# Patient Record
Sex: Female | Born: 1935 | Race: White | Hispanic: No | Marital: Married | Smoking: Former smoker
Health system: Southern US, Community
[De-identification: ages and names within clinical notes are randomized; demographics above are authoritative.]

## PROBLEM LIST (undated history)

## (undated) DIAGNOSIS — C189 Malignant neoplasm of colon, unspecified: Secondary | ICD-10-CM

## (undated) DIAGNOSIS — M069 Rheumatoid arthritis, unspecified: Secondary | ICD-10-CM

## (undated) DIAGNOSIS — D869 Sarcoidosis, unspecified: Secondary | ICD-10-CM

## (undated) HISTORY — PX: HEMICOLECTOMY: SHX854

## (undated) HISTORY — PX: TONSILLECTOMY: SUR1361

## (undated) HISTORY — PX: COLONOSCOPY: SHX174

## (undated) HISTORY — PX: BREAST EXCISIONAL BIOPSY: SUR124

---

## 2003-07-20 DIAGNOSIS — C189 Malignant neoplasm of colon, unspecified: Secondary | ICD-10-CM

## 2003-07-20 HISTORY — DX: Malignant neoplasm of colon, unspecified: C18.9

## 2003-12-24 ENCOUNTER — Other Ambulatory Visit: Admission: RE | Admit: 2003-12-24 | Discharge: 2003-12-24 | Payer: Self-pay | Admitting: Gynecology

## 2005-01-06 ENCOUNTER — Ambulatory Visit: Payer: Self-pay | Admitting: Gastroenterology

## 2005-05-13 ENCOUNTER — Ambulatory Visit: Payer: Self-pay | Admitting: Internal Medicine

## 2005-11-15 ENCOUNTER — Other Ambulatory Visit: Admission: RE | Admit: 2005-11-15 | Discharge: 2005-11-15 | Payer: Self-pay | Admitting: Gynecology

## 2006-05-16 ENCOUNTER — Ambulatory Visit: Payer: Self-pay | Admitting: Internal Medicine

## 2007-07-04 ENCOUNTER — Ambulatory Visit: Payer: Self-pay | Admitting: Internal Medicine

## 2008-04-02 ENCOUNTER — Ambulatory Visit: Payer: Self-pay | Admitting: Unknown Physician Specialty

## 2008-07-04 ENCOUNTER — Ambulatory Visit: Payer: Self-pay

## 2008-09-11 ENCOUNTER — Ambulatory Visit: Payer: Self-pay | Admitting: Internal Medicine

## 2009-09-12 ENCOUNTER — Ambulatory Visit: Payer: Self-pay | Admitting: Internal Medicine

## 2010-09-14 ENCOUNTER — Ambulatory Visit: Payer: Self-pay | Admitting: Internal Medicine

## 2011-09-17 ENCOUNTER — Ambulatory Visit: Payer: Self-pay | Admitting: Internal Medicine

## 2011-11-30 ENCOUNTER — Ambulatory Visit: Payer: Self-pay | Admitting: Unknown Physician Specialty

## 2012-11-15 ENCOUNTER — Ambulatory Visit: Payer: Self-pay | Admitting: Internal Medicine

## 2014-08-15 ENCOUNTER — Ambulatory Visit: Payer: Self-pay | Admitting: Internal Medicine

## 2015-07-23 DIAGNOSIS — D510 Vitamin B12 deficiency anemia due to intrinsic factor deficiency: Secondary | ICD-10-CM | POA: Diagnosis not present

## 2015-07-23 DIAGNOSIS — M0579 Rheumatoid arthritis with rheumatoid factor of multiple sites without organ or systems involvement: Secondary | ICD-10-CM | POA: Diagnosis not present

## 2015-07-23 DIAGNOSIS — Z79899 Other long term (current) drug therapy: Secondary | ICD-10-CM | POA: Diagnosis not present

## 2015-07-31 DIAGNOSIS — D510 Vitamin B12 deficiency anemia due to intrinsic factor deficiency: Secondary | ICD-10-CM | POA: Diagnosis not present

## 2015-07-31 DIAGNOSIS — Z0000 Encounter for general adult medical examination without abnormal findings: Secondary | ICD-10-CM | POA: Diagnosis not present

## 2015-08-01 ENCOUNTER — Other Ambulatory Visit: Payer: Self-pay | Admitting: Internal Medicine

## 2015-08-01 DIAGNOSIS — Z1231 Encounter for screening mammogram for malignant neoplasm of breast: Secondary | ICD-10-CM

## 2015-08-18 ENCOUNTER — Ambulatory Visit
Admission: RE | Admit: 2015-08-18 | Discharge: 2015-08-18 | Disposition: A | Discharge status: Home / Self Care | Payer: PPO | Source: Ambulatory Visit | Attending: Internal Medicine | Admitting: Internal Medicine

## 2015-08-18 ENCOUNTER — Other Ambulatory Visit: Payer: Self-pay | Admitting: Internal Medicine

## 2015-08-18 DIAGNOSIS — Z1231 Encounter for screening mammogram for malignant neoplasm of breast: Secondary | ICD-10-CM

## 2015-10-24 DIAGNOSIS — Z79899 Other long term (current) drug therapy: Secondary | ICD-10-CM | POA: Diagnosis not present

## 2015-10-24 DIAGNOSIS — M0579 Rheumatoid arthritis with rheumatoid factor of multiple sites without organ or systems involvement: Secondary | ICD-10-CM | POA: Diagnosis not present

## 2015-10-30 DIAGNOSIS — M0579 Rheumatoid arthritis with rheumatoid factor of multiple sites without organ or systems involvement: Secondary | ICD-10-CM | POA: Diagnosis not present

## 2015-10-30 DIAGNOSIS — Z79899 Other long term (current) drug therapy: Secondary | ICD-10-CM | POA: Diagnosis not present

## 2015-10-30 DIAGNOSIS — M150 Primary generalized (osteo)arthritis: Secondary | ICD-10-CM | POA: Diagnosis not present

## 2016-01-28 DIAGNOSIS — Z79899 Other long term (current) drug therapy: Secondary | ICD-10-CM | POA: Diagnosis not present

## 2016-01-28 DIAGNOSIS — M0579 Rheumatoid arthritis with rheumatoid factor of multiple sites without organ or systems involvement: Secondary | ICD-10-CM | POA: Diagnosis not present

## 2016-02-06 DIAGNOSIS — H2512 Age-related nuclear cataract, left eye: Secondary | ICD-10-CM | POA: Diagnosis not present

## 2016-04-29 DIAGNOSIS — M0579 Rheumatoid arthritis with rheumatoid factor of multiple sites without organ or systems involvement: Secondary | ICD-10-CM | POA: Diagnosis not present

## 2016-04-29 DIAGNOSIS — Z79899 Other long term (current) drug therapy: Secondary | ICD-10-CM | POA: Diagnosis not present

## 2016-07-26 DIAGNOSIS — Z79899 Other long term (current) drug therapy: Secondary | ICD-10-CM | POA: Diagnosis not present

## 2016-07-27 DIAGNOSIS — Z0000 Encounter for general adult medical examination without abnormal findings: Secondary | ICD-10-CM | POA: Diagnosis not present

## 2016-07-27 DIAGNOSIS — D510 Vitamin B12 deficiency anemia due to intrinsic factor deficiency: Secondary | ICD-10-CM | POA: Diagnosis not present

## 2016-08-06 DIAGNOSIS — M059 Rheumatoid arthritis with rheumatoid factor, unspecified: Secondary | ICD-10-CM | POA: Diagnosis not present

## 2016-08-06 DIAGNOSIS — Z0000 Encounter for general adult medical examination without abnormal findings: Secondary | ICD-10-CM | POA: Diagnosis not present

## 2016-08-06 DIAGNOSIS — Z85038 Personal history of other malignant neoplasm of large intestine: Secondary | ICD-10-CM | POA: Diagnosis not present

## 2016-08-12 ENCOUNTER — Other Ambulatory Visit: Payer: Self-pay | Admitting: Internal Medicine

## 2016-08-12 DIAGNOSIS — Z1231 Encounter for screening mammogram for malignant neoplasm of breast: Secondary | ICD-10-CM

## 2016-09-09 ENCOUNTER — Ambulatory Visit: Payer: PPO | Attending: Internal Medicine

## 2016-10-08 DIAGNOSIS — Z86010 Personal history of colonic polyps: Secondary | ICD-10-CM | POA: Diagnosis not present

## 2016-10-12 ENCOUNTER — Ambulatory Visit
Admission: RE | Admit: 2016-10-12 | Discharge: 2016-10-12 | Disposition: A | Discharge status: Home / Self Care | Payer: PPO | Source: Ambulatory Visit | Attending: Internal Medicine | Admitting: Internal Medicine

## 2016-10-12 ENCOUNTER — Encounter: Payer: Self-pay | Admitting: Radiology

## 2016-10-12 DIAGNOSIS — Z1231 Encounter for screening mammogram for malignant neoplasm of breast: Secondary | ICD-10-CM | POA: Diagnosis not present

## 2016-10-19 DIAGNOSIS — Z79899 Other long term (current) drug therapy: Secondary | ICD-10-CM | POA: Diagnosis not present

## 2016-10-19 DIAGNOSIS — M0579 Rheumatoid arthritis with rheumatoid factor of multiple sites without organ or systems involvement: Secondary | ICD-10-CM | POA: Diagnosis not present

## 2016-10-19 DIAGNOSIS — M150 Primary generalized (osteo)arthritis: Secondary | ICD-10-CM | POA: Diagnosis not present

## 2016-12-14 ENCOUNTER — Encounter: Payer: Self-pay | Admitting: *Deleted

## 2016-12-15 ENCOUNTER — Ambulatory Visit: Payer: PPO | Admitting: Anesthesiology

## 2016-12-15 ENCOUNTER — Encounter: Payer: Self-pay | Admitting: Anesthesiology

## 2016-12-15 ENCOUNTER — Encounter
Admission: RE | Disposition: A | Discharge status: Home / Self Care | Payer: Self-pay | Source: Ambulatory Visit | Attending: Unknown Physician Specialty

## 2016-12-15 ENCOUNTER — Ambulatory Visit
Admission: RE | Admit: 2016-12-15 | Discharge: 2016-12-15 | Disposition: A | Discharge status: Home / Self Care | Payer: PPO | Source: Ambulatory Visit | Attending: Unknown Physician Specialty | Admitting: Unknown Physician Specialty

## 2016-12-15 DIAGNOSIS — D869 Sarcoidosis, unspecified: Secondary | ICD-10-CM | POA: Insufficient documentation

## 2016-12-15 DIAGNOSIS — K5790 Diverticulosis of intestine, part unspecified, without perforation or abscess without bleeding: Secondary | ICD-10-CM | POA: Diagnosis not present

## 2016-12-15 DIAGNOSIS — Z87891 Personal history of nicotine dependence: Secondary | ICD-10-CM | POA: Insufficient documentation

## 2016-12-15 DIAGNOSIS — K6389 Other specified diseases of intestine: Secondary | ICD-10-CM | POA: Diagnosis not present

## 2016-12-15 DIAGNOSIS — Z9049 Acquired absence of other specified parts of digestive tract: Secondary | ICD-10-CM | POA: Insufficient documentation

## 2016-12-15 DIAGNOSIS — K5730 Diverticulosis of large intestine without perforation or abscess without bleeding: Secondary | ICD-10-CM | POA: Diagnosis not present

## 2016-12-15 DIAGNOSIS — Z85038 Personal history of other malignant neoplasm of large intestine: Secondary | ICD-10-CM | POA: Diagnosis not present

## 2016-12-15 DIAGNOSIS — K640 First degree hemorrhoids: Secondary | ICD-10-CM | POA: Diagnosis not present

## 2016-12-15 DIAGNOSIS — K635 Polyp of colon: Secondary | ICD-10-CM | POA: Diagnosis not present

## 2016-12-15 DIAGNOSIS — Z86010 Personal history of colonic polyps: Secondary | ICD-10-CM | POA: Diagnosis not present

## 2016-12-15 DIAGNOSIS — K648 Other hemorrhoids: Secondary | ICD-10-CM | POA: Diagnosis not present

## 2016-12-15 DIAGNOSIS — M069 Rheumatoid arthritis, unspecified: Secondary | ICD-10-CM | POA: Diagnosis not present

## 2016-12-15 DIAGNOSIS — K621 Rectal polyp: Secondary | ICD-10-CM | POA: Insufficient documentation

## 2016-12-15 HISTORY — DX: Rheumatoid arthritis, unspecified: M06.9

## 2016-12-15 HISTORY — DX: Sarcoidosis, unspecified: D86.9

## 2016-12-15 HISTORY — PX: COLONOSCOPY WITH PROPOFOL: SHX5780

## 2016-12-15 SURGERY — COLONOSCOPY WITH PROPOFOL
Anesthesia: General

## 2016-12-15 MED ORDER — PROPOFOL 500 MG/50ML IV EMUL
INTRAVENOUS | Status: DC | PRN
  Administered 2016-12-15: 120 ug/kg/min via INTRAVENOUS

## 2016-12-15 MED ORDER — FENTANYL CITRATE (PF) 100 MCG/2ML IJ SOLN
INTRAMUSCULAR | Status: DC | PRN
  Administered 2016-12-15: 50 ug via INTRAVENOUS

## 2016-12-15 MED ORDER — SODIUM CHLORIDE 0.9 % IV SOLN
INTRAVENOUS | Status: DC
  Administered 2016-12-15: 1000 mL via INTRAVENOUS

## 2016-12-15 MED ORDER — MIDAZOLAM HCL 2 MG/2ML IJ SOLN
INTRAMUSCULAR | Status: DC | PRN
  Administered 2016-12-15: 1 mg via INTRAVENOUS

## 2016-12-15 MED ORDER — MIDAZOLAM HCL 2 MG/2ML IJ SOLN
INTRAMUSCULAR | Status: AC
  Filled 2016-12-15: qty 2

## 2016-12-15 MED ORDER — PROPOFOL 500 MG/50ML IV EMUL
INTRAVENOUS | Status: AC
  Filled 2016-12-15: qty 50

## 2016-12-15 MED ORDER — SODIUM CHLORIDE 0.9 % IV SOLN: INTRAVENOUS | Status: DC

## 2016-12-15 MED ORDER — FENTANYL CITRATE (PF) 100 MCG/2ML IJ SOLN
INTRAMUSCULAR | Status: AC
  Filled 2016-12-15: qty 2

## 2016-12-15 NOTE — Anesthesia Preprocedure Evaluation (Signed)
Anesthesia Evaluation  Patient identified by MRN, date of birth, ID band Patient awake    Reviewed: Allergy & Precautions, H&P , NPO status , Patient's Chart, lab work & pertinent test results, reviewed documented beta blocker date and time   Airway Mallampati: II   Neck ROM: full    Dental  (+) Poor Dentition   Pulmonary neg pulmonary ROS, former smoker,    Pulmonary exam normal        Cardiovascular negative cardio ROS Normal cardiovascular exam Rhythm:regular Rate:Normal     Neuro/Psych negative neurological ROS  negative psych ROS   GI/Hepatic negative GI ROS, Neg liver ROS,   Endo/Other  negative endocrine ROS  Renal/GU negative Renal ROS  negative genitourinary   Musculoskeletal   Abdominal   Peds  Hematology negative hematology ROS (+)   Anesthesia Other Findings Past Medical History: 2005: Cancer ([place])     Comment: colon ca No date: Rheumatoid arthritis ([place]) No date: Sarcoidosis Past Surgical History: No date: BREAST BIOPSY Right No date: COLONOSCOPY No date: HEMICOLECTOMY No date: TONSILLECTOMY BMI    Body Mass Index:  24.50 kg/m     Reproductive/Obstetrics negative OB ROS                             Anesthesia Physical Anesthesia Plan  ASA: II  Anesthesia Plan: General   Post-op Pain Management:    Induction:   Airway Management Planned:   Additional Equipment:   Intra-op Plan:   Post-operative Plan:   Informed Consent: I have reviewed the patients History and Physical, chart, labs and discussed the procedure including the risks, benefits and alternatives for the proposed anesthesia with the patient or authorized representative who has indicated his/her understanding and acceptance.   Dental Advisory Given  Plan Discussed with: CRNA  Anesthesia Plan Comments:         Anesthesia Quick Evaluation

## 2016-12-15 NOTE — Anesthesia Procedure Notes (Signed)
Performed by: COOK-MARTIN, Keyanna Sandefer Pre-anesthesia Checklist: Patient identified, Emergency Drugs available, Suction available, Patient being monitored and Timeout performed Patient Re-evaluated:Patient Re-evaluated prior to inductionOxygen Delivery Method: Nasal cannula Preoxygenation: Pre-oxygenation with 100% oxygen Intubation Type: IV induction Placement Confirmation: positive ETCO2 and CO2 detector       

## 2016-12-15 NOTE — Op Note (Signed)
[facility] Patient Name: Melody Mendoza Procedure Date: 12/15/2016 10:29 AM MRN: [id number] Account #: [id number] Date of Birth: 01/30/1936 Admit Type: Outpatient Age: 81 Room: [facility] ENDO ROOM 3 Gender: Female Note Status: Finalized Procedure:            Colonoscopy Indications:          High risk colon cancer surveillance: Personal history                        of colon cancer Providers:            Manya Silvas, MD Referring MD:         Rusty Aus, MD (Referring MD) Medicines:            Propofol per Anesthesia Complications:        No immediate complications. Procedure:            Pre-Anesthesia Assessment:                       - After reviewing the risks and benefits, the patient                        was deemed in satisfactory condition to undergo the                        procedure.                       After obtaining informed consent, the colonoscope was                        passed under direct vision. Throughout the procedure,                        the patient's blood pressure, pulse, and oxygen                        saturations were monitored continuously. The                        Colonoscope was introduced through the anus and                        advanced to the the ileocolonic anastomosis. The                        colonoscopy was performed without difficulty. The                        patient tolerated the procedure well. The quality of                        the bowel preparation was excellent. Findings:      A small polyp was found in the distal descending colon. The polyp was       sessile. The polyp was removed with a hot snare. Resection and retrieval       were complete.      A diminutive polyp was found in the recto-sigmoid colon. The polyp was       sessile. The polyp was removed with a jumbo cold forceps. Resection and  retrieval were complete.      Multiple medium-mouthed diverticula were found  in the sigmoid colon.      Internal hemorrhoids were found during endoscopy. The hemorrhoids were       small and Grade I (internal hemorrhoids that do not prolapse).      The exam was otherwise without abnormality. Impression:           - One small polyp in the distal descending colon,                        removed with a hot snare. Resected and retrieved.                       - One diminutive polyp at the recto-sigmoid colon,                        removed with a jumbo cold forceps. Resected and                        retrieved.                       - Diverticulosis in the sigmoid colon.                       - Internal hemorrhoids.                       - The examination was otherwise normal. Recommendation:       - Await pathology results. Manya Silvas, MD 12/15/2016 11:12:57 AM This report has been signed electronically. Number of Addenda: 0 Note Initiated On: 12/15/2016 10:29 AM Scope Withdrawal Time: 0 hours 11 minutes 44 seconds  Total Procedure Duration: 0 hours 20 minutes 22 seconds       [facility]

## 2016-12-15 NOTE — Anesthesia Post-op Follow-up Note (Cosign Needed)
Anesthesia QCDR form completed.        

## 2016-12-15 NOTE — H&P (Signed)
   Primary Care Physician:  Rusty Aus, MD Primary Gastroenterologist:  Dr. Vira Agar  Pre-Procedure History & Physical: HPI:  Melody Mendoza is a 81 y.o. female is here for an colonoscopy.   Past Medical History:  Diagnosis Date  . Cancer [facility]) 2005   colon ca  . Rheumatoid arthritis ([place])   . Sarcoidosis     Past Surgical History:  Procedure Laterality Date  . BREAST BIOPSY Right   . COLONOSCOPY    . HEMICOLECTOMY    . TONSILLECTOMY      Prior to Admission medications   Medication Sig Start Date End Date Taking? Authorizing Provider  Calcium Carb-Cholecalciferol (OS-CAL CALCIUM + D3) 500-200 MG-UNIT TABS Take by mouth.   Yes [provider]  Cholecalciferol 1000 units tablet Take 1,000 Units by mouth daily.   Yes [provider]  folic acid (FOLVITE) 1 MG tablet Take 1 mg by mouth daily.   Yes [provider]  methotrexate (RHEUMATREX) 2.5 MG tablet Take 2.5 mg by mouth once a week. Caution:Chemotherapy. Protect from light.   Yes [provider]  vitamin B-12 (CYANOCOBALAMIN) 1000 MCG tablet Take 1,000 mcg by mouth daily.   Yes [provider]    Allergies as of 10/11/2016  . (Not on File)    History reviewed. No pertinent family history.  Social History   Social History  . Marital status: Married    Spouse name: N/A  . Number of children: N/A  . Years of education: N/A   Occupational History  . Not on file.   Social History Main Topics  . Smoking status: Former Research scientist (life sciences)  . Smokeless tobacco: Never Used  . Alcohol use Not on file  . Drug use: Unknown  . Sexual activity: Not on file   Other Topics Concern  . Not on file   Social History Narrative  . No narrative on file    Review of Systems: See HPI, otherwise negative ROS  Physical Exam: BP (!) 122/59   Pulse (!) 57   Temp (!) 96.1 F (35.6 C) (Tympanic)   Resp 18   Ht 5' 4.5" (1.638 m)   Wt 65.8 kg (145 lb)   SpO2 100%   BMI 24.50 kg/m   General:   Alert,  pleasant and cooperative in NAD Head:  Normocephalic and atraumatic. Neck:  Supple; no masses or thyromegaly. Lungs:  Clear throughout to auscultation.    Heart:  Regular rate and rhythm. Abdomen:  Soft, nontender and nondistended. Normal bowel sounds, without guarding, and without rebound.   Neurologic:  Alert and  oriented x4;  grossly normal neurologically.  Impression/Plan: Melody Mendoza is here for an colonoscopy to be performed for [facility] colon cancer  Risks, benefits, limitations, and alternatives regarding  colonoscopy have been reviewed with the patient.  Questions have been answered.  All parties agreeable.   Gaylyn Cheers, MD  12/15/2016, 10:38 AM

## 2016-12-15 NOTE — Transfer of Care (Signed)
Immediate Anesthesia Transfer of Care Note  Patient: Melody Mendoza  Procedure(s) Performed: Procedure(s): COLONOSCOPY WITH PROPOFOL (N/A)  Patient Location: PACU  Anesthesia Type:General  Level of Consciousness: awake and oriented  Airway & Oxygen Therapy: Patient Spontanous Breathing and Patient connected to nasal cannula oxygen  Post-op Assessment: Report given to RN and Post -op Vital signs reviewed and stable  Post vital signs: Reviewed and stable  Last Vitals:  Vitals:   12/15/16 1001  BP: (!) 122/59  Pulse: (!) 57  Resp: 18  Temp: (!) 35.6 C    Last Pain:  Vitals:   12/15/16 1001  TempSrc: Tympanic         Complications: No apparent anesthesia complications

## 2016-12-16 ENCOUNTER — Encounter: Payer: Self-pay | Admitting: Unknown Physician Specialty

## 2016-12-16 LAB — SURGICAL PATHOLOGY

## 2016-12-20 NOTE — Anesthesia Postprocedure Evaluation (Signed)
Anesthesia Post Note  Patient: Melody Mendoza  Procedure(s) Performed: Procedure(s) (LRB): COLONOSCOPY WITH PROPOFOL (N/A)  Patient location during evaluation: PACU Anesthesia Type: General Level of consciousness: awake and alert Pain management: pain level controlled Vital Signs Assessment: post-procedure vital signs reviewed and stable Respiratory status: spontaneous breathing, nonlabored ventilation, respiratory function stable and patient connected to nasal cannula oxygen Cardiovascular status: blood pressure returned to baseline and stable Postop Assessment: no signs of nausea or vomiting Anesthetic complications: no     Last Vitals:  Vitals:   12/15/16 1130 12/15/16 1140  BP: 106/62 117/69  Pulse: 60   Resp: 13   Temp:      Last Pain:  Vitals:   12/16/16 0752  TempSrc:   PainSc: 0-No pain                 Molli Barrows

## 2016-12-21 DIAGNOSIS — K5792 Diverticulitis of intestine, part unspecified, without perforation or abscess without bleeding: Secondary | ICD-10-CM | POA: Diagnosis not present

## 2017-03-23 DIAGNOSIS — H353131 Nonexudative age-related macular degeneration, bilateral, early dry stage: Secondary | ICD-10-CM | POA: Diagnosis not present

## 2017-04-14 DIAGNOSIS — Z0000 Encounter for general adult medical examination without abnormal findings: Secondary | ICD-10-CM | POA: Diagnosis not present

## 2017-04-14 DIAGNOSIS — Z23 Encounter for immunization: Secondary | ICD-10-CM | POA: Diagnosis not present

## 2017-07-27 DIAGNOSIS — H353131 Nonexudative age-related macular degeneration, bilateral, early dry stage: Secondary | ICD-10-CM | POA: Diagnosis not present

## 2017-07-27 DIAGNOSIS — H2512 Age-related nuclear cataract, left eye: Secondary | ICD-10-CM | POA: Diagnosis not present

## 2017-08-02 DIAGNOSIS — E538 Deficiency of other specified B group vitamins: Secondary | ICD-10-CM | POA: Diagnosis not present

## 2017-08-02 DIAGNOSIS — Z0000 Encounter for general adult medical examination without abnormal findings: Secondary | ICD-10-CM | POA: Diagnosis not present

## 2017-08-02 DIAGNOSIS — I10 Essential (primary) hypertension: Secondary | ICD-10-CM | POA: Diagnosis not present

## 2017-08-09 DIAGNOSIS — Z0000 Encounter for general adult medical examination without abnormal findings: Secondary | ICD-10-CM | POA: Diagnosis not present

## 2017-08-09 DIAGNOSIS — M059 Rheumatoid arthritis with rheumatoid factor, unspecified: Secondary | ICD-10-CM | POA: Diagnosis not present

## 2017-08-09 DIAGNOSIS — Z79899 Other long term (current) drug therapy: Secondary | ICD-10-CM | POA: Diagnosis not present

## 2017-09-02 ENCOUNTER — Other Ambulatory Visit: Payer: Self-pay | Admitting: Internal Medicine

## 2017-09-02 DIAGNOSIS — Z1231 Encounter for screening mammogram for malignant neoplasm of breast: Secondary | ICD-10-CM

## 2017-09-16 DIAGNOSIS — H2512 Age-related nuclear cataract, left eye: Secondary | ICD-10-CM | POA: Diagnosis not present

## 2017-09-19 ENCOUNTER — Encounter: Payer: Self-pay | Admitting: *Deleted

## 2017-09-28 ENCOUNTER — Encounter
Admission: RE | Disposition: A | Discharge status: Home / Self Care | Payer: Self-pay | Source: Ambulatory Visit | Attending: Ophthalmology

## 2017-09-28 ENCOUNTER — Encounter: Payer: Self-pay | Admitting: Emergency Medicine

## 2017-09-28 ENCOUNTER — Ambulatory Visit
Admission: RE | Admit: 2017-09-28 | Discharge: 2017-09-28 | Disposition: A | Discharge status: Home / Self Care | Payer: PPO | Source: Ambulatory Visit | Attending: Ophthalmology | Admitting: Ophthalmology

## 2017-09-28 ENCOUNTER — Ambulatory Visit: Payer: PPO | Admitting: Anesthesiology

## 2017-09-28 DIAGNOSIS — Z87891 Personal history of nicotine dependence: Secondary | ICD-10-CM | POA: Insufficient documentation

## 2017-09-28 DIAGNOSIS — H2512 Age-related nuclear cataract, left eye: Secondary | ICD-10-CM | POA: Diagnosis not present

## 2017-09-28 HISTORY — PX: CATARACT EXTRACTION W/PHACO: SHX586

## 2017-09-28 SURGERY — PHACOEMULSIFICATION, CATARACT, WITH IOL INSERTION
Anesthesia: Monitor Anesthesia Care | Site: Eye | Laterality: Left | Wound class: Clean

## 2017-09-28 MED ORDER — BSS IO SOLN
INTRAOCULAR | Status: DC | PRN
  Administered 2017-09-28: 09:00:00 via OPHTHALMIC

## 2017-09-28 MED ORDER — FENTANYL CITRATE (PF) 100 MCG/2ML IJ SOLN
INTRAMUSCULAR | Status: DC | PRN
  Administered 2017-09-28: 50 ug via INTRAVENOUS

## 2017-09-28 MED ORDER — MIDAZOLAM HCL 2 MG/2ML IJ SOLN
INTRAMUSCULAR | Status: AC
  Filled 2017-09-28: qty 2

## 2017-09-28 MED ORDER — MOXIFLOXACIN HCL 0.5 % OP SOLN: 1.0000 [drp] | OPHTHALMIC | Status: DC | PRN

## 2017-09-28 MED ORDER — CARBACHOL 0.01 % IO SOLN
INTRAOCULAR | Status: DC | PRN
  Administered 2017-09-28: 0.5 mL via INTRAOCULAR

## 2017-09-28 MED ORDER — MOXIFLOXACIN HCL 0.5 % OP SOLN
OPHTHALMIC | Status: AC
  Filled 2017-09-28: qty 3

## 2017-09-28 MED ORDER — ONDANSETRON HCL 4 MG/2ML IJ SOLN
INTRAMUSCULAR | Status: DC | PRN
  Administered 2017-09-28: 4 mg via INTRAVENOUS

## 2017-09-28 MED ORDER — ARMC OPHTHALMIC DILATING DROPS
OPHTHALMIC | Status: AC
  Administered 2017-09-28: 1 via OPHTHALMIC
  Filled 2017-09-28: qty 0.4

## 2017-09-28 MED ORDER — NA CHONDROIT SULF-NA HYALURON 40-17 MG/ML IO SOLN
INTRAOCULAR | Status: DC | PRN
  Administered 2017-09-28: 1 mL via INTRAOCULAR

## 2017-09-28 MED ORDER — EPINEPHRINE PF 1 MG/ML IJ SOLN
INTRAMUSCULAR | Status: AC
  Filled 2017-09-28: qty 2

## 2017-09-28 MED ORDER — MIDAZOLAM HCL 2 MG/2ML IJ SOLN
INTRAMUSCULAR | Status: DC | PRN
  Administered 2017-09-28: 1 mg via INTRAVENOUS

## 2017-09-28 MED ORDER — NA CHONDROIT SULF-NA HYALURON 40-17 MG/ML IO SOLN
INTRAOCULAR | Status: AC
  Filled 2017-09-28: qty 1

## 2017-09-28 MED ORDER — SODIUM CHLORIDE 0.9 % IV SOLN
INTRAVENOUS | Status: DC
  Administered 2017-09-28 (×2): via INTRAVENOUS

## 2017-09-28 MED ORDER — POVIDONE-IODINE 5 % OP SOLN
OPHTHALMIC | Status: AC
  Filled 2017-09-28: qty 30

## 2017-09-28 MED ORDER — LIDOCAINE HCL (PF) 4 % IJ SOLN
INTRAMUSCULAR | Status: DC | PRN
  Administered 2017-09-28: 4 mL via OPHTHALMIC

## 2017-09-28 MED ORDER — POVIDONE-IODINE 5 % OP SOLN
OPHTHALMIC | Status: DC | PRN
  Administered 2017-09-28: 1 via OPHTHALMIC

## 2017-09-28 MED ORDER — FENTANYL CITRATE (PF) 100 MCG/2ML IJ SOLN
INTRAMUSCULAR | Status: AC
  Filled 2017-09-28: qty 2

## 2017-09-28 MED ORDER — ARMC OPHTHALMIC DILATING DROPS
1.0000 "application " | OPHTHALMIC | Status: AC
  Administered 2017-09-28 (×3): 1 via OPHTHALMIC

## 2017-09-28 MED ORDER — LIDOCAINE HCL (PF) 4 % IJ SOLN
INTRAMUSCULAR | Status: AC
  Filled 2017-09-28: qty 5

## 2017-09-28 MED ORDER — MOXIFLOXACIN HCL 0.5 % OP SOLN
OPHTHALMIC | Status: DC | PRN
  Administered 2017-09-28: 0.2 mL via OPHTHALMIC

## 2017-09-28 MED ORDER — ONDANSETRON HCL 4 MG/2ML IJ SOLN
INTRAMUSCULAR | Status: AC
  Filled 2017-09-28: qty 2

## 2017-09-28 SURGICAL SUPPLY — 16 items
GLOVE BIO SURGEON STRL SZ8 (GLOVE) ×3 IMPLANT
GLOVE BIOGEL M 6.5 STRL (GLOVE) ×3 IMPLANT
GLOVE SURG LX 8.0 MICRO (GLOVE) ×2
GLOVE SURG LX STRL 8.0 MICRO (GLOVE) ×1 IMPLANT
GOWN STRL REUS W/ TWL LRG LVL3 (GOWN DISPOSABLE) ×2 IMPLANT
GOWN STRL REUS W/TWL LRG LVL3 (GOWN DISPOSABLE) ×4
LABEL CATARACT MEDS ST (LABEL) ×3 IMPLANT
LENS IOL TECNIS ITEC 22.5 (Intraocular Lens) ×3 IMPLANT
PACK CATARACT (MISCELLANEOUS) ×3 IMPLANT
PACK CATARACT BRASINGTON LX (MISCELLANEOUS) ×3 IMPLANT
PACK EYE AFTER SURG (MISCELLANEOUS) ×3 IMPLANT
SOL BSS BAG (MISCELLANEOUS) ×3
SOLUTION BSS BAG (MISCELLANEOUS) ×1 IMPLANT
SYR 5ML LL (SYRINGE) ×3 IMPLANT
WATER STERILE IRR 250ML POUR (IV SOLUTION) ×3 IMPLANT
WIPE NON LINTING 3.25X3.25 (MISCELLANEOUS) ×3 IMPLANT

## 2017-09-28 NOTE — Discharge Instructions (Signed)
Eye Surgery Discharge Instructions  Expect mild scratchy sensation or mild soreness. DO NOT RUB YOUR EYE!  The day of surgery:  Minimal physical activity, but bed rest is not required  No reading, computer work, or close hand work  No bending, lifting, or straining.  May watch TV  For 24 hours:  No driving, legal decisions, or alcoholic beverages  Safety precautions  Eat anything you prefer: It is better to start with liquids, then soup then solid foods.  _____ Eye patch should be worn until postoperative exam tomorrow.  ____ Solar shield eyeglasses should be worn for comfort in the sunlight/patch while sleeping  Resume all regular medications including aspirin or Coumadin if these were discontinued prior to surgery. You may shower, bathe, shave, or wash your hair. Tylenol may be taken for mild discomfort.  Call your doctor if you experience significant pain, nausea, or vomiting, fever > 101 or other signs of infection. [phone] or [phone] Specific instructions:  Follow-up Information    Birder Robson, MD Follow up.   Specialty:  Ophthalmology Why:  March 14 at 10:35am Contact information: [address] [place] [address] [phone]

## 2017-09-28 NOTE — Anesthesia Postprocedure Evaluation (Signed)
Anesthesia Post Note  Patient: Melody Mendoza  Procedure(s) Performed: CATARACT EXTRACTION PHACO AND INTRAOCULAR LENS PLACEMENT ([place]) (Left Eye)  Patient location during evaluation: Short Stay Anesthesia Type: MAC Level of consciousness: awake Pain management: pain level controlled Vital Signs Assessment: post-procedure vital signs reviewed and stable Respiratory status: spontaneous breathing Cardiovascular status: blood pressure returned to baseline Postop Assessment: no headache Anesthetic complications: no     Last Vitals:  Vitals:   09/28/17 0752  BP: 133/69  Pulse: 67  Resp: 17  Temp: (!) 35.8 C  SpO2: 100%    Last Pain:  Vitals:   09/28/17 0752  TempSrc: Tympanic                 Buckner Malta

## 2017-09-28 NOTE — Op Note (Signed)
PREOPERATIVE DIAGNOSIS:  Nuclear sclerotic cataract of the left eye.   POSTOPERATIVE DIAGNOSIS:  Nuclear sclerotic cataract of the left eye.   OPERATIVE PROCEDURE: Procedure(s): CATARACT EXTRACTION PHACO AND INTRAOCULAR LENS PLACEMENT ([facility])   SURGEON:  Birder Robson, MD.   ANESTHESIA:  Anesthesiologist: Alphonsus Sias, MD CRNA: Allean Found, CRNA  1.      Managed anesthesia care. 2.     0.51ml of Shugarcaine was instilled following the paracentesis   COMPLICATIONS:  None.   TECHNIQUE:   Stop and chop   DESCRIPTION OF PROCEDURE:  The patient was examined and consented in the preoperative holding area where the aforementioned topical anesthesia was applied to the left eye and then brought back to the Operating Room where the left eye was prepped and draped in the usual sterile ophthalmic fashion and a lid speculum was placed. A paracentesis was created with the side port blade and the anterior chamber was filled with viscoelastic. A near clear corneal incision was performed with the steel keratome. A continuous curvilinear capsulorrhexis was performed with a cystotome followed by the capsulorrhexis forceps. Hydrodissection and hydrodelineation were carried out with BSS on a blunt cannula. The lens was removed in a stop and chop  technique and the remaining cortical material was removed with the irrigation-aspiration handpiece. The capsular bag was inflated with viscoelastic and the Technis [id number] lens was placed in the capsular bag without complication. The remaining viscoelastic was removed from the eye with the irrigation-aspiration handpiece. The wounds were hydrated. The anterior chamber was flushed with Miostat and the eye was inflated to physiologic pressure. 0.24ml Vigamox was placed in the anterior chamber. The wounds were found to be water tight. The eye was dressed with Vigamox. The patient was given protective glasses to wear throughout the day and a shield with which to sleep  tonight. The patient was also given drops with which to begin a drop regimen today and will follow-up with me in one day. Implant Name Type Inv. Item Serial No. Manufacturer Lot No. LRB No. Used  LENS IOL DIOP 22.5 - [id number] Intraocular Lens LENS IOL DIOP 22.5 [id number] AMO  Left 1    Procedure(s) with comments: CATARACT EXTRACTION PHACO AND INTRAOCULAR LENS PLACEMENT ([facility]) (Left) - [place] 00:28.3 AP% 12.3 CDE 3.39  Fluid Pack Lot # [id number]  Electronically signed: Birder Robson 09/28/2017 8:43 AM

## 2017-09-28 NOTE — Anesthesia Preprocedure Evaluation (Signed)
Anesthesia Evaluation  Patient identified by MRN, date of birth, ID band Patient awake    Reviewed: Allergy & Precautions, H&P , NPO status , reviewed documented beta blocker date and time   Airway Mallampati: II  TM Distance: >3 FB     Dental  (+) Caps   Pulmonary former smoker,    Pulmonary exam normal        Cardiovascular negative cardio ROS Normal cardiovascular exam     Neuro/Psych negative neurological ROS     GI/Hepatic negative GI ROS, Neg liver ROS, neg GERD  ,  Endo/Other  negative endocrine ROS  Renal/GU negative Renal ROS  negative genitourinary   Musculoskeletal   Abdominal   Peds  Hematology negative hematology ROS (+)   Anesthesia Other Findings   Reproductive/Obstetrics negative OB ROS                             Anesthesia Physical Anesthesia Plan  ASA: II  Anesthesia Plan: MAC   Post-op Pain Management:    Induction:   PONV Risk Score and Plan: 2 and Propofol infusion, Ondansetron and Midazolam  Airway Management Planned:   Additional Equipment:   Intra-op Plan:   Post-operative Plan:   Informed Consent: I have reviewed the patients History and Physical, chart, labs and discussed the procedure including the risks, benefits and alternatives for the proposed anesthesia with the patient or authorized representative who has indicated his/her understanding and acceptance.   Dental Advisory Given  Plan Discussed with: CRNA  Anesthesia Plan Comments:         Anesthesia Quick Evaluation

## 2017-09-28 NOTE — Anesthesia Post-op Follow-up Note (Signed)
Anesthesia QCDR form completed.        

## 2017-09-28 NOTE — Anesthesia Procedure Notes (Signed)
Procedure Name: MAC Date/Time: 09/28/2017 8:25 AM Performed by: Allean Found, CRNA Pre-anesthesia Checklist: Patient identified, Emergency Drugs available, Suction available, Patient being monitored and Timeout performed Oxygen Delivery Method: Nasal cannula Placement Confirmation: positive ETCO2

## 2017-09-28 NOTE — H&P (Signed)
All labs reviewed. Abnormal studies sent to patients PCP when indicated.  Previous H&P reviewed, patient examined, there are NO CHANGES.  Melody Swiderski Porfilio3/13/20198:19 AM

## 2017-09-28 NOTE — Transfer of Care (Signed)
Immediate Anesthesia Transfer of Care Note  Patient: Melody Mendoza  Procedure(s) Performed: CATARACT EXTRACTION PHACO AND INTRAOCULAR LENS PLACEMENT ([facility]) (Left Eye)  Patient Location: PACU  Anesthesia Type:MAC  Level of Consciousness: awake  Airway & Oxygen Therapy: Patient Spontanous Breathing  Post-op Assessment: Report given to RN and Post -op Vital signs reviewed and stable  Post vital signs: Reviewed and stable  Last Vitals:  Vitals:   09/28/17 0752  BP: 133/69  Pulse: 67  Resp: 17  Temp: (!) 35.8 C  SpO2: 100%    Last Pain:  Vitals:   09/28/17 0752  TempSrc: Tympanic         Complications: No apparent anesthesia complications

## 2017-09-28 NOTE — OR Nursing (Signed)
dischargre instructions discussed with pt and family. Both voice understanding.

## 2017-10-13 ENCOUNTER — Ambulatory Visit
Admission: RE | Admit: 2017-10-13 | Discharge: 2017-10-13 | Disposition: A | Discharge status: Home / Self Care | Payer: PPO | Source: Ambulatory Visit | Attending: Internal Medicine | Admitting: Internal Medicine

## 2017-10-13 DIAGNOSIS — Z1231 Encounter for screening mammogram for malignant neoplasm of breast: Secondary | ICD-10-CM | POA: Diagnosis not present

## 2017-10-13 HISTORY — DX: Malignant neoplasm of colon, unspecified: C18.9

## 2017-10-19 DIAGNOSIS — Z79899 Other long term (current) drug therapy: Secondary | ICD-10-CM | POA: Diagnosis not present

## 2017-10-19 DIAGNOSIS — M150 Primary generalized (osteo)arthritis: Secondary | ICD-10-CM | POA: Diagnosis not present

## 2017-10-19 DIAGNOSIS — M0579 Rheumatoid arthritis with rheumatoid factor of multiple sites without organ or systems involvement: Secondary | ICD-10-CM | POA: Diagnosis not present

## 2017-10-25 DIAGNOSIS — M150 Primary generalized (osteo)arthritis: Secondary | ICD-10-CM | POA: Diagnosis not present

## 2017-10-25 DIAGNOSIS — M0579 Rheumatoid arthritis with rheumatoid factor of multiple sites without organ or systems involvement: Secondary | ICD-10-CM | POA: Diagnosis not present

## 2018-04-20 DIAGNOSIS — S90129A Contusion of unspecified lesser toe(s) without damage to nail, initial encounter: Secondary | ICD-10-CM | POA: Diagnosis not present

## 2018-04-20 DIAGNOSIS — L821 Other seborrheic keratosis: Secondary | ICD-10-CM | POA: Diagnosis not present

## 2018-04-20 DIAGNOSIS — L84 Corns and callosities: Secondary | ICD-10-CM | POA: Diagnosis not present

## 2018-04-20 DIAGNOSIS — L820 Inflamed seborrheic keratosis: Secondary | ICD-10-CM | POA: Diagnosis not present

## 2018-04-20 DIAGNOSIS — D1801 Hemangioma of skin and subcutaneous tissue: Secondary | ICD-10-CM | POA: Diagnosis not present

## 2018-04-20 DIAGNOSIS — L538 Other specified erythematous conditions: Secondary | ICD-10-CM | POA: Diagnosis not present

## 2018-05-01 DIAGNOSIS — H43813 Vitreous degeneration, bilateral: Secondary | ICD-10-CM | POA: Diagnosis not present

## 2018-06-29 DIAGNOSIS — L309 Dermatitis, unspecified: Secondary | ICD-10-CM | POA: Diagnosis not present

## 2018-06-29 DIAGNOSIS — R58 Hemorrhage, not elsewhere classified: Secondary | ICD-10-CM | POA: Diagnosis not present

## 2018-06-29 DIAGNOSIS — L538 Other specified erythematous conditions: Secondary | ICD-10-CM | POA: Diagnosis not present

## 2018-06-29 DIAGNOSIS — L820 Inflamed seborrheic keratosis: Secondary | ICD-10-CM | POA: Diagnosis not present

## 2018-06-29 DIAGNOSIS — L298 Other pruritus: Secondary | ICD-10-CM | POA: Diagnosis not present

## 2018-08-03 DIAGNOSIS — M059 Rheumatoid arthritis with rheumatoid factor, unspecified: Secondary | ICD-10-CM | POA: Diagnosis not present

## 2018-08-03 DIAGNOSIS — E538 Deficiency of other specified B group vitamins: Secondary | ICD-10-CM | POA: Diagnosis not present

## 2018-08-03 DIAGNOSIS — I10 Essential (primary) hypertension: Secondary | ICD-10-CM | POA: Diagnosis not present

## 2018-08-03 DIAGNOSIS — Z79899 Other long term (current) drug therapy: Secondary | ICD-10-CM | POA: Diagnosis not present

## 2018-08-10 DIAGNOSIS — Z79899 Other long term (current) drug therapy: Secondary | ICD-10-CM | POA: Diagnosis not present

## 2018-08-10 DIAGNOSIS — Z0000 Encounter for general adult medical examination without abnormal findings: Secondary | ICD-10-CM | POA: Diagnosis not present

## 2018-08-14 ENCOUNTER — Other Ambulatory Visit: Payer: Self-pay | Admitting: Internal Medicine

## 2018-08-14 DIAGNOSIS — Z1231 Encounter for screening mammogram for malignant neoplasm of breast: Secondary | ICD-10-CM

## 2018-09-19 DIAGNOSIS — L309 Dermatitis, unspecified: Secondary | ICD-10-CM | POA: Diagnosis not present

## 2019-02-13 ENCOUNTER — Ambulatory Visit
Admission: RE | Admit: 2019-02-13 | Discharge: 2019-02-13 | Disposition: A | Discharge status: Home / Self Care | Payer: PPO | Source: Ambulatory Visit | Attending: Internal Medicine | Admitting: Internal Medicine

## 2019-02-13 ENCOUNTER — Other Ambulatory Visit: Payer: Self-pay

## 2019-02-13 DIAGNOSIS — Z1231 Encounter for screening mammogram for malignant neoplasm of breast: Secondary | ICD-10-CM | POA: Insufficient documentation

## 2019-05-07 DIAGNOSIS — H353131 Nonexudative age-related macular degeneration, bilateral, early dry stage: Secondary | ICD-10-CM | POA: Diagnosis not present

## 2019-08-06 DIAGNOSIS — Z79899 Other long term (current) drug therapy: Secondary | ICD-10-CM | POA: Diagnosis not present

## 2019-08-13 DIAGNOSIS — Z0000 Encounter for general adult medical examination without abnormal findings: Secondary | ICD-10-CM | POA: Diagnosis not present

## 2019-08-13 DIAGNOSIS — Z780 Asymptomatic menopausal state: Secondary | ICD-10-CM | POA: Diagnosis not present

## 2019-08-13 DIAGNOSIS — Z79899 Other long term (current) drug therapy: Secondary | ICD-10-CM | POA: Diagnosis not present

## 2019-08-16 DIAGNOSIS — M8588 Other specified disorders of bone density and structure, other site: Secondary | ICD-10-CM | POA: Diagnosis not present

## 2019-12-16 IMAGING — MG MM DIGITAL SCREENING BILAT W/ TOMO W/ CAD
9 of 12 series · 9 of 28 positions shown · non-contrast
Comparison: Previous exam(s).

CLINICAL DATA: Screening.

EXAM:
DIGITAL SCREENING BILATERAL MAMMOGRAM WITH TOMO AND CAD

[R MLO]
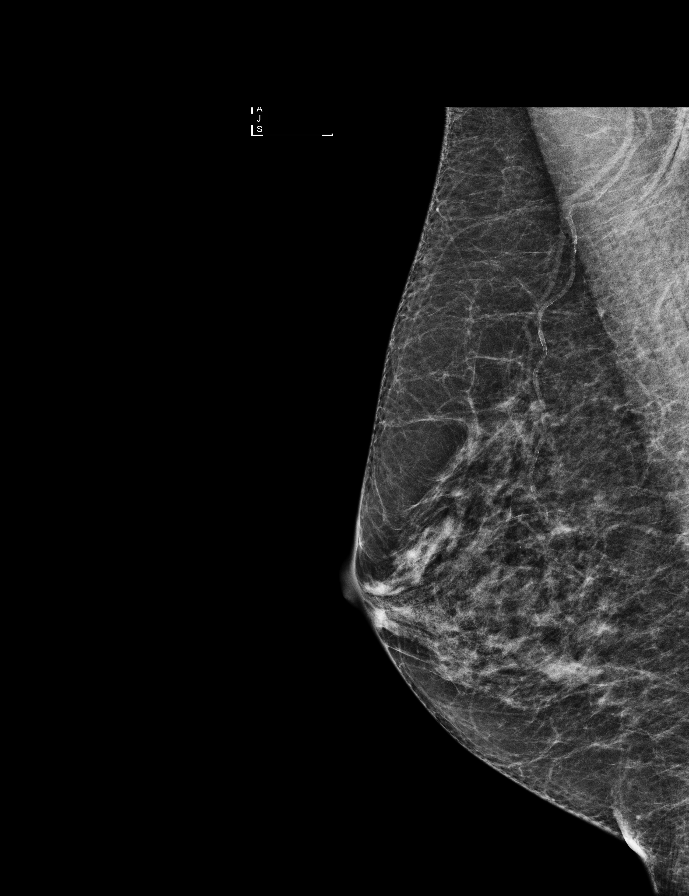

[L CC synth-2D]
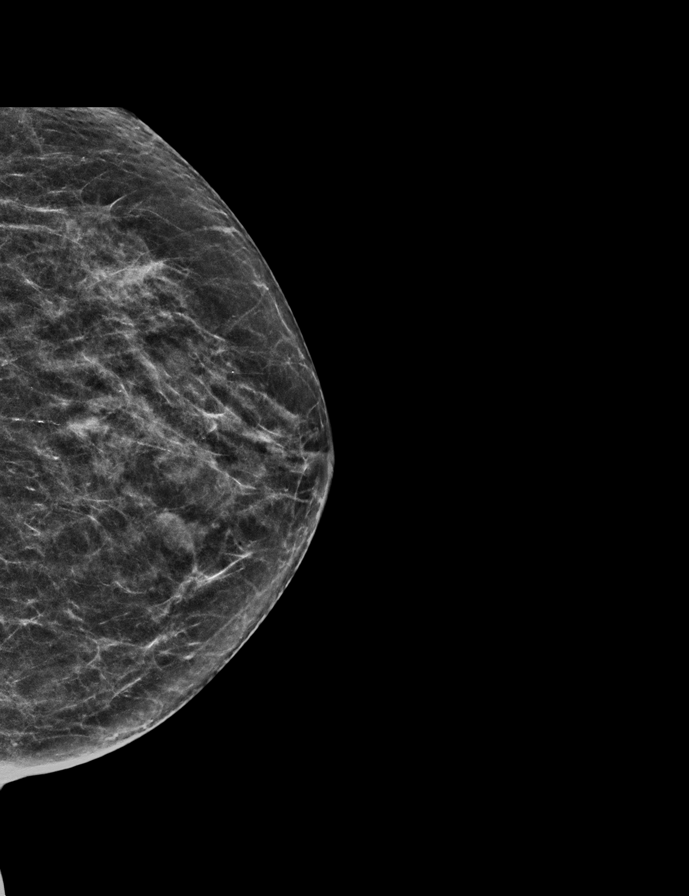

[R CC synth-2D]
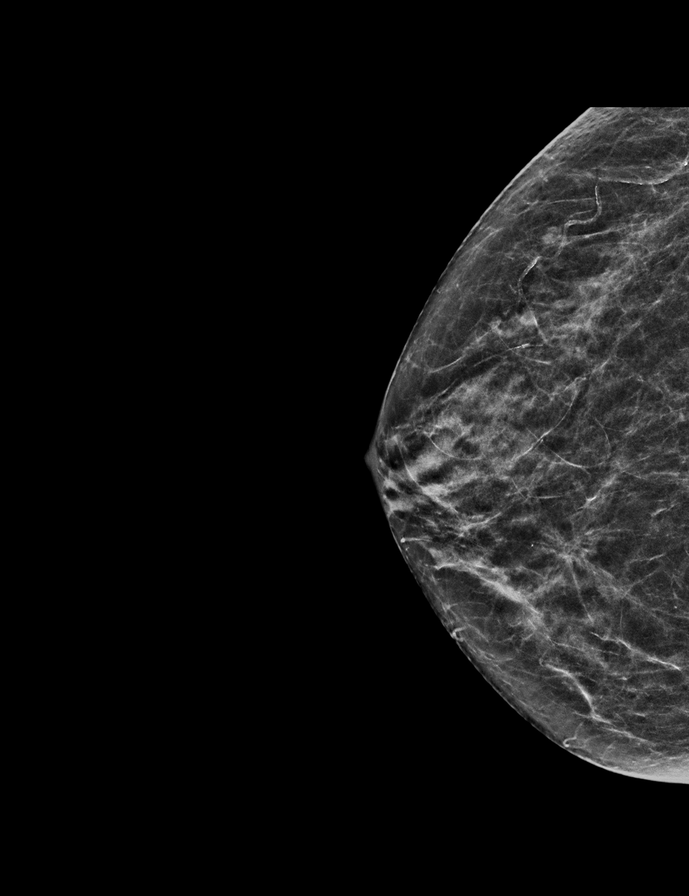

[R CC]
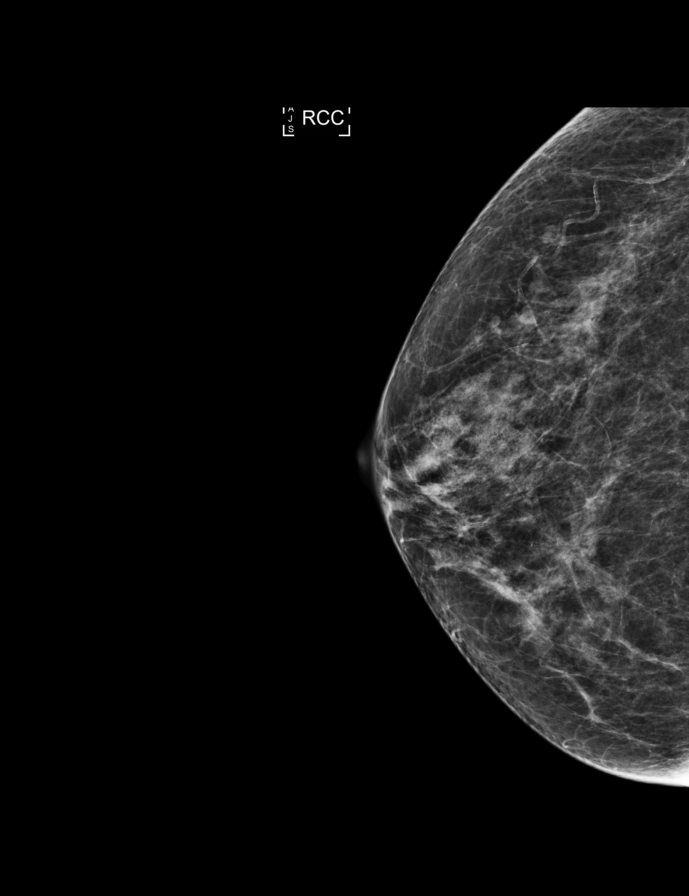

[L CC]
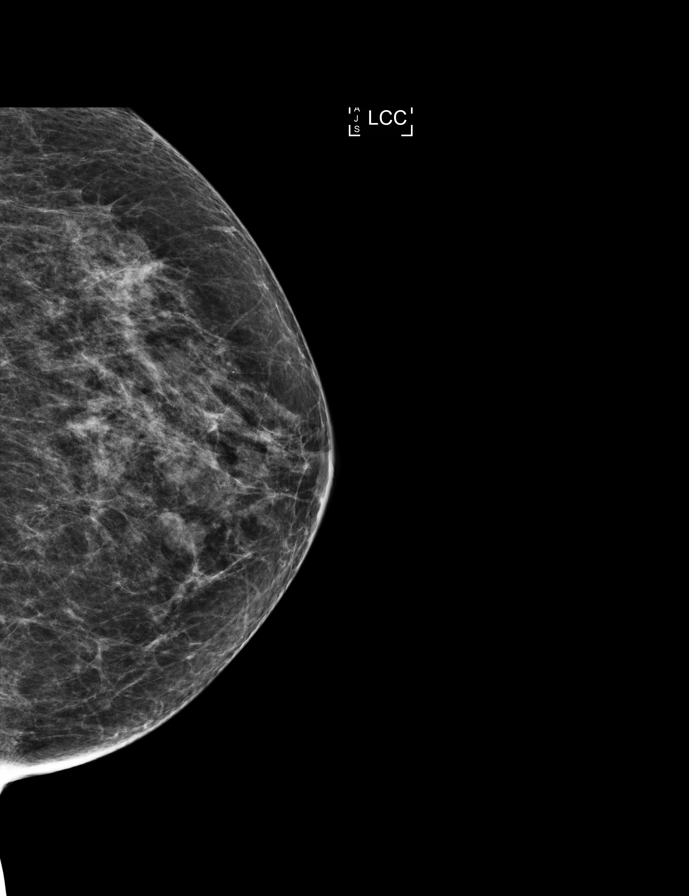

[R MLO synth-2D]
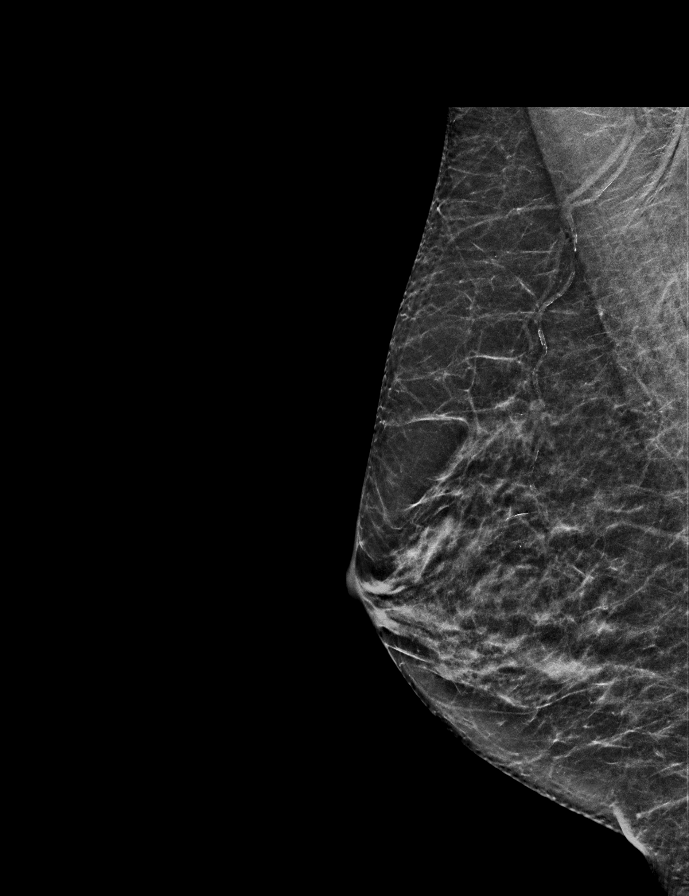

[L MLO synth-2D]
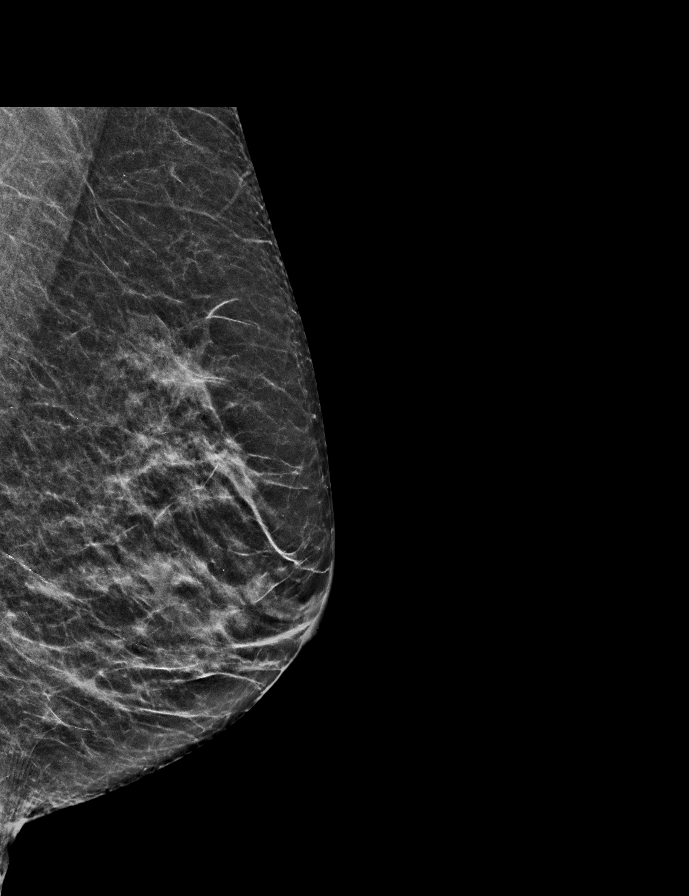

[L MLO]
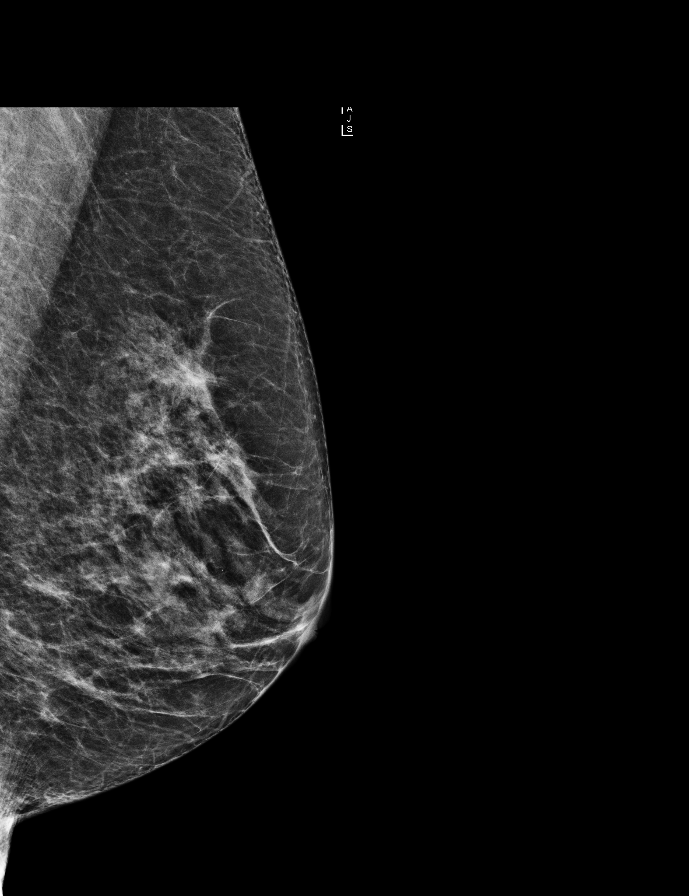

[L MLO tomo · tomo slice 25/48.0]
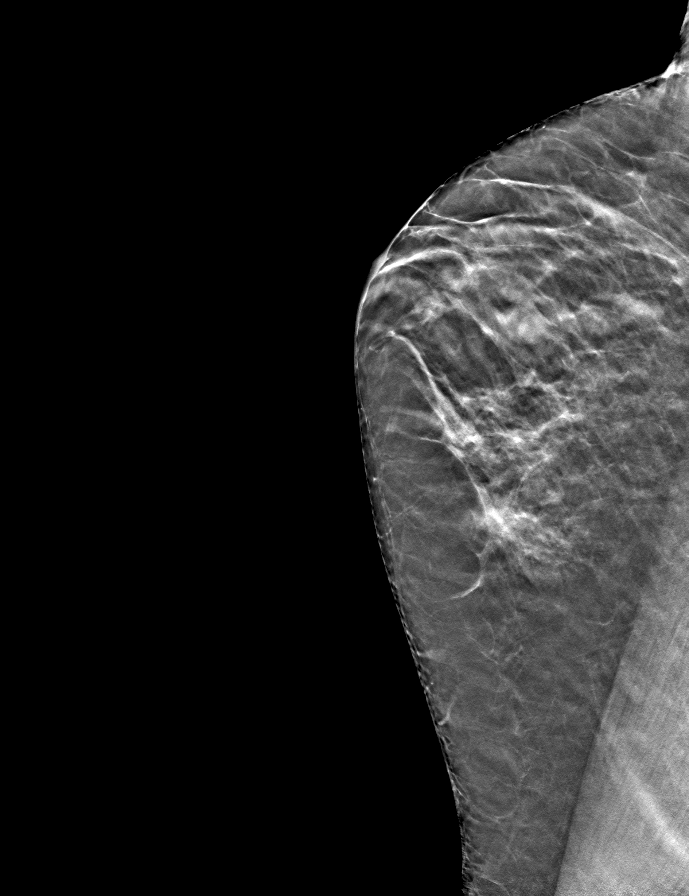

[9 of 28 positions shown; findings below may reference images not displayed]

ACR Breast Density Category c: The breast tissue is heterogeneously
dense, which may obscure small masses.
FINDINGS: There are no findings suspicious for malignancy. Images were
processed with CAD.
IMPRESSION: No mammographic evidence of malignancy. A result letter of this
screening mammogram will be mailed directly to the patient.

RECOMMENDATION:
Screening mammogram in one year. (Code:[id number]-[id number])

BI-RADS CATEGORY  1: Negative.

## 2020-01-14 ENCOUNTER — Other Ambulatory Visit: Payer: Self-pay | Admitting: Internal Medicine

## 2020-01-14 DIAGNOSIS — Z1231 Encounter for screening mammogram for malignant neoplasm of breast: Secondary | ICD-10-CM

## 2020-02-14 ENCOUNTER — Ambulatory Visit
Admission: RE | Admit: 2020-02-14 | Discharge: 2020-02-14 | Disposition: A | Discharge status: Home / Self Care | Payer: PPO | Source: Ambulatory Visit | Attending: Internal Medicine | Admitting: Internal Medicine

## 2020-02-14 DIAGNOSIS — Z1231 Encounter for screening mammogram for malignant neoplasm of breast: Secondary | ICD-10-CM | POA: Insufficient documentation

## 2020-05-07 DIAGNOSIS — H353221 Exudative age-related macular degeneration, left eye, with active choroidal neovascularization: Secondary | ICD-10-CM | POA: Diagnosis not present

## 2020-08-07 DIAGNOSIS — Z79899 Other long term (current) drug therapy: Secondary | ICD-10-CM | POA: Diagnosis not present

## 2020-08-07 DIAGNOSIS — N390 Urinary tract infection, site not specified: Secondary | ICD-10-CM | POA: Diagnosis not present

## 2020-08-13 DIAGNOSIS — H353131 Nonexudative age-related macular degeneration, bilateral, early dry stage: Secondary | ICD-10-CM | POA: Diagnosis not present

## 2020-08-14 DIAGNOSIS — Z0000 Encounter for general adult medical examination without abnormal findings: Secondary | ICD-10-CM | POA: Diagnosis not present

## 2020-08-14 DIAGNOSIS — Z79899 Other long term (current) drug therapy: Secondary | ICD-10-CM | POA: Diagnosis not present

## 2020-08-19 ENCOUNTER — Ambulatory Visit: Payer: PPO | Admitting: Podiatry

## 2020-09-04 ENCOUNTER — Other Ambulatory Visit: Payer: Self-pay | Admitting: Internal Medicine

## 2020-09-05 ENCOUNTER — Other Ambulatory Visit: Payer: Self-pay | Admitting: Internal Medicine

## 2020-09-05 DIAGNOSIS — Z1231 Encounter for screening mammogram for malignant neoplasm of breast: Secondary | ICD-10-CM

## 2021-02-12 DIAGNOSIS — H353131 Nonexudative age-related macular degeneration, bilateral, early dry stage: Secondary | ICD-10-CM | POA: Diagnosis not present

## 2021-02-24 ENCOUNTER — Other Ambulatory Visit: Payer: Self-pay

## 2021-02-24 ENCOUNTER — Ambulatory Visit
Admission: RE | Admit: 2021-02-24 | Discharge: 2021-02-24 | Disposition: A | Discharge status: Home / Self Care | Payer: PPO | Source: Ambulatory Visit | Attending: Internal Medicine | Admitting: Internal Medicine

## 2021-02-24 DIAGNOSIS — Z1231 Encounter for screening mammogram for malignant neoplasm of breast: Secondary | ICD-10-CM | POA: Diagnosis not present

## 2021-04-09 DIAGNOSIS — H353132 Nonexudative age-related macular degeneration, bilateral, intermediate dry stage: Secondary | ICD-10-CM | POA: Diagnosis not present

## 2021-05-09 DIAGNOSIS — H353132 Nonexudative age-related macular degeneration, bilateral, intermediate dry stage: Secondary | ICD-10-CM | POA: Diagnosis not present

## 2021-05-11 DIAGNOSIS — H353131 Nonexudative age-related macular degeneration, bilateral, early dry stage: Secondary | ICD-10-CM | POA: Diagnosis not present

## 2021-06-08 DIAGNOSIS — H353132 Nonexudative age-related macular degeneration, bilateral, intermediate dry stage: Secondary | ICD-10-CM | POA: Diagnosis not present

## 2021-07-08 DIAGNOSIS — H353132 Nonexudative age-related macular degeneration, bilateral, intermediate dry stage: Secondary | ICD-10-CM | POA: Diagnosis not present

## 2021-08-07 DIAGNOSIS — H353132 Nonexudative age-related macular degeneration, bilateral, intermediate dry stage: Secondary | ICD-10-CM | POA: Diagnosis not present

## 2021-08-14 DIAGNOSIS — H25041 Posterior subcapsular polar age-related cataract, right eye: Secondary | ICD-10-CM | POA: Diagnosis not present

## 2021-08-21 DIAGNOSIS — Z79899 Other long term (current) drug therapy: Secondary | ICD-10-CM | POA: Diagnosis not present

## 2021-08-21 DIAGNOSIS — E538 Deficiency of other specified B group vitamins: Secondary | ICD-10-CM | POA: Diagnosis not present

## 2021-08-21 DIAGNOSIS — Z0000 Encounter for general adult medical examination without abnormal findings: Secondary | ICD-10-CM | POA: Diagnosis not present

## 2021-09-06 DIAGNOSIS — H353132 Nonexudative age-related macular degeneration, bilateral, intermediate dry stage: Secondary | ICD-10-CM | POA: Diagnosis not present

## 2021-10-06 DIAGNOSIS — H353132 Nonexudative age-related macular degeneration, bilateral, intermediate dry stage: Secondary | ICD-10-CM | POA: Diagnosis not present

## 2021-11-05 DIAGNOSIS — H353132 Nonexudative age-related macular degeneration, bilateral, intermediate dry stage: Secondary | ICD-10-CM | POA: Diagnosis not present

## 2021-12-05 DIAGNOSIS — H353132 Nonexudative age-related macular degeneration, bilateral, intermediate dry stage: Secondary | ICD-10-CM | POA: Diagnosis not present

## 2021-12-21 DIAGNOSIS — H353221 Exudative age-related macular degeneration, left eye, with active choroidal neovascularization: Secondary | ICD-10-CM | POA: Diagnosis not present

## 2022-01-02 DIAGNOSIS — S8991XA Unspecified injury of right lower leg, initial encounter: Secondary | ICD-10-CM | POA: Diagnosis not present

## 2022-01-02 DIAGNOSIS — W208XXA Other cause of strike by thrown, projected or falling object, initial encounter: Secondary | ICD-10-CM | POA: Diagnosis not present

## 2022-01-02 DIAGNOSIS — Z743 Need for continuous supervision: Secondary | ICD-10-CM | POA: Diagnosis not present

## 2022-01-02 DIAGNOSIS — Z23 Encounter for immunization: Secondary | ICD-10-CM | POA: Diagnosis not present

## 2022-01-02 DIAGNOSIS — S81811A Laceration without foreign body, right lower leg, initial encounter: Secondary | ICD-10-CM | POA: Diagnosis not present

## 2022-01-04 DIAGNOSIS — H353112 Nonexudative age-related macular degeneration, right eye, intermediate dry stage: Secondary | ICD-10-CM | POA: Diagnosis not present

## 2022-01-11 DIAGNOSIS — S81811A Laceration without foreign body, right lower leg, initial encounter: Secondary | ICD-10-CM | POA: Diagnosis not present

## 2022-01-11 DIAGNOSIS — L03115 Cellulitis of right lower limb: Secondary | ICD-10-CM | POA: Diagnosis not present

## 2022-01-13 DIAGNOSIS — L03115 Cellulitis of right lower limb: Secondary | ICD-10-CM | POA: Diagnosis not present

## 2022-01-13 DIAGNOSIS — S81811A Laceration without foreign body, right lower leg, initial encounter: Secondary | ICD-10-CM | POA: Diagnosis not present

## 2022-01-20 DIAGNOSIS — S81811A Laceration without foreign body, right lower leg, initial encounter: Secondary | ICD-10-CM | POA: Diagnosis not present

## 2022-01-20 DIAGNOSIS — L03115 Cellulitis of right lower limb: Secondary | ICD-10-CM | POA: Diagnosis not present

## 2022-01-22 ENCOUNTER — Other Ambulatory Visit: Payer: Self-pay | Admitting: Internal Medicine

## 2022-01-22 DIAGNOSIS — Z1231 Encounter for screening mammogram for malignant neoplasm of breast: Secondary | ICD-10-CM

## 2022-01-25 DIAGNOSIS — S81811A Laceration without foreign body, right lower leg, initial encounter: Secondary | ICD-10-CM | POA: Diagnosis not present

## 2022-01-25 DIAGNOSIS — L03115 Cellulitis of right lower limb: Secondary | ICD-10-CM | POA: Diagnosis not present

## 2022-01-28 DIAGNOSIS — H353221 Exudative age-related macular degeneration, left eye, with active choroidal neovascularization: Secondary | ICD-10-CM | POA: Diagnosis not present

## 2022-02-03 DIAGNOSIS — H353112 Nonexudative age-related macular degeneration, right eye, intermediate dry stage: Secondary | ICD-10-CM | POA: Diagnosis not present

## 2022-02-09 DIAGNOSIS — A09 Infectious gastroenteritis and colitis, unspecified: Secondary | ICD-10-CM | POA: Diagnosis not present

## 2022-02-09 DIAGNOSIS — S81811A Laceration without foreign body, right lower leg, initial encounter: Secondary | ICD-10-CM | POA: Diagnosis not present

## 2022-02-10 DIAGNOSIS — A09 Infectious gastroenteritis and colitis, unspecified: Secondary | ICD-10-CM | POA: Diagnosis not present

## 2022-02-25 ENCOUNTER — Ambulatory Visit
Admission: RE | Admit: 2022-02-25 | Discharge: 2022-02-25 | Disposition: A | Discharge status: Home / Self Care | Payer: PPO | Source: Ambulatory Visit | Attending: Internal Medicine | Admitting: Internal Medicine

## 2022-02-25 DIAGNOSIS — Z1231 Encounter for screening mammogram for malignant neoplasm of breast: Secondary | ICD-10-CM | POA: Diagnosis not present

## 2022-03-01 DIAGNOSIS — H353221 Exudative age-related macular degeneration, left eye, with active choroidal neovascularization: Secondary | ICD-10-CM | POA: Diagnosis not present

## 2022-03-02 DIAGNOSIS — L03115 Cellulitis of right lower limb: Secondary | ICD-10-CM | POA: Diagnosis not present

## 2022-03-02 DIAGNOSIS — A0472 Enterocolitis due to Clostridium difficile, not specified as recurrent: Secondary | ICD-10-CM | POA: Diagnosis not present

## 2022-03-05 DIAGNOSIS — H353112 Nonexudative age-related macular degeneration, right eye, intermediate dry stage: Secondary | ICD-10-CM | POA: Diagnosis not present

## 2022-03-25 DIAGNOSIS — S81811D Laceration without foreign body, right lower leg, subsequent encounter: Secondary | ICD-10-CM | POA: Diagnosis not present

## 2022-03-25 DIAGNOSIS — L03115 Cellulitis of right lower limb: Secondary | ICD-10-CM | POA: Diagnosis not present

## 2022-04-04 DIAGNOSIS — H353112 Nonexudative age-related macular degeneration, right eye, intermediate dry stage: Secondary | ICD-10-CM | POA: Diagnosis not present

## 2022-04-12 DIAGNOSIS — H353221 Exudative age-related macular degeneration, left eye, with active choroidal neovascularization: Secondary | ICD-10-CM | POA: Diagnosis not present

## 2022-05-04 DIAGNOSIS — H353112 Nonexudative age-related macular degeneration, right eye, intermediate dry stage: Secondary | ICD-10-CM | POA: Diagnosis not present

## 2022-05-26 DIAGNOSIS — H353221 Exudative age-related macular degeneration, left eye, with active choroidal neovascularization: Secondary | ICD-10-CM | POA: Diagnosis not present

## 2022-06-03 DIAGNOSIS — H353112 Nonexudative age-related macular degeneration, right eye, intermediate dry stage: Secondary | ICD-10-CM | POA: Diagnosis not present

## 2022-07-03 DIAGNOSIS — H353112 Nonexudative age-related macular degeneration, right eye, intermediate dry stage: Secondary | ICD-10-CM | POA: Diagnosis not present

## 2022-07-23 DIAGNOSIS — H353221 Exudative age-related macular degeneration, left eye, with active choroidal neovascularization: Secondary | ICD-10-CM | POA: Diagnosis not present

## 2022-08-02 DIAGNOSIS — H353112 Nonexudative age-related macular degeneration, right eye, intermediate dry stage: Secondary | ICD-10-CM | POA: Diagnosis not present

## 2022-08-16 DIAGNOSIS — E538 Deficiency of other specified B group vitamins: Secondary | ICD-10-CM | POA: Diagnosis not present

## 2022-08-16 DIAGNOSIS — Z79899 Other long term (current) drug therapy: Secondary | ICD-10-CM | POA: Diagnosis not present

## 2022-08-23 DIAGNOSIS — E538 Deficiency of other specified B group vitamins: Secondary | ICD-10-CM | POA: Diagnosis not present

## 2022-08-23 DIAGNOSIS — Z79899 Other long term (current) drug therapy: Secondary | ICD-10-CM | POA: Diagnosis not present

## 2022-08-23 DIAGNOSIS — E782 Mixed hyperlipidemia: Secondary | ICD-10-CM | POA: Diagnosis not present

## 2022-08-23 DIAGNOSIS — Z0000 Encounter for general adult medical examination without abnormal findings: Secondary | ICD-10-CM | POA: Diagnosis not present

## 2022-08-23 DIAGNOSIS — Z23 Encounter for immunization: Secondary | ICD-10-CM | POA: Diagnosis not present

## 2022-09-01 DIAGNOSIS — H353112 Nonexudative age-related macular degeneration, right eye, intermediate dry stage: Secondary | ICD-10-CM | POA: Diagnosis not present

## 2022-09-15 DIAGNOSIS — H353221 Exudative age-related macular degeneration, left eye, with active choroidal neovascularization: Secondary | ICD-10-CM | POA: Diagnosis not present

## 2022-10-01 DIAGNOSIS — H353112 Nonexudative age-related macular degeneration, right eye, intermediate dry stage: Secondary | ICD-10-CM | POA: Diagnosis not present

## 2022-10-31 DIAGNOSIS — H353112 Nonexudative age-related macular degeneration, right eye, intermediate dry stage: Secondary | ICD-10-CM | POA: Diagnosis not present

## 2022-11-10 DIAGNOSIS — H353221 Exudative age-related macular degeneration, left eye, with active choroidal neovascularization: Secondary | ICD-10-CM | POA: Diagnosis not present

## 2022-11-22 DIAGNOSIS — E538 Deficiency of other specified B group vitamins: Secondary | ICD-10-CM | POA: Diagnosis not present

## 2022-11-30 DIAGNOSIS — H353112 Nonexudative age-related macular degeneration, right eye, intermediate dry stage: Secondary | ICD-10-CM | POA: Diagnosis not present

## 2022-12-10 DIAGNOSIS — N812 Incomplete uterovaginal prolapse: Secondary | ICD-10-CM | POA: Diagnosis not present

## 2022-12-30 DIAGNOSIS — H353112 Nonexudative age-related macular degeneration, right eye, intermediate dry stage: Secondary | ICD-10-CM | POA: Diagnosis not present

## 2023-01-05 DIAGNOSIS — M3501 Sicca syndrome with keratoconjunctivitis: Secondary | ICD-10-CM | POA: Diagnosis not present

## 2023-01-05 DIAGNOSIS — H353221 Exudative age-related macular degeneration, left eye, with active choroidal neovascularization: Secondary | ICD-10-CM | POA: Diagnosis not present

## 2023-01-05 DIAGNOSIS — Z961 Presence of intraocular lens: Secondary | ICD-10-CM | POA: Diagnosis not present

## 2023-01-29 DIAGNOSIS — H353112 Nonexudative age-related macular degeneration, right eye, intermediate dry stage: Secondary | ICD-10-CM | POA: Diagnosis not present

## 2023-01-31 ENCOUNTER — Other Ambulatory Visit: Payer: Self-pay | Admitting: Internal Medicine

## 2023-01-31 DIAGNOSIS — Z1231 Encounter for screening mammogram for malignant neoplasm of breast: Secondary | ICD-10-CM

## 2023-02-28 DIAGNOSIS — H353112 Nonexudative age-related macular degeneration, right eye, intermediate dry stage: Secondary | ICD-10-CM | POA: Diagnosis not present

## 2023-03-01 ENCOUNTER — Ambulatory Visit
Admission: RE | Admit: 2023-03-01 | Discharge: 2023-03-01 | Disposition: A | Discharge status: Home / Self Care | Payer: PPO | Source: Ambulatory Visit | Attending: Internal Medicine | Admitting: Internal Medicine

## 2023-03-01 DIAGNOSIS — Z1231 Encounter for screening mammogram for malignant neoplasm of breast: Secondary | ICD-10-CM | POA: Diagnosis not present

## 2023-03-03 DIAGNOSIS — H353221 Exudative age-related macular degeneration, left eye, with active choroidal neovascularization: Secondary | ICD-10-CM | POA: Diagnosis not present

## 2023-03-30 DIAGNOSIS — H353112 Nonexudative age-related macular degeneration, right eye, intermediate dry stage: Secondary | ICD-10-CM | POA: Diagnosis not present

## 2023-05-09 DIAGNOSIS — H353221 Exudative age-related macular degeneration, left eye, with active choroidal neovascularization: Secondary | ICD-10-CM | POA: Diagnosis not present

## 2023-06-28 DIAGNOSIS — H353112 Nonexudative age-related macular degeneration, right eye, intermediate dry stage: Secondary | ICD-10-CM | POA: Diagnosis not present

## 2023-07-01 DIAGNOSIS — H353221 Exudative age-related macular degeneration, left eye, with active choroidal neovascularization: Secondary | ICD-10-CM | POA: Diagnosis not present

## 2023-07-28 DIAGNOSIS — H353112 Nonexudative age-related macular degeneration, right eye, intermediate dry stage: Secondary | ICD-10-CM | POA: Diagnosis not present

## 2023-08-18 DIAGNOSIS — E782 Mixed hyperlipidemia: Secondary | ICD-10-CM | POA: Diagnosis not present

## 2023-08-18 DIAGNOSIS — E538 Deficiency of other specified B group vitamins: Secondary | ICD-10-CM | POA: Diagnosis not present

## 2023-08-25 DIAGNOSIS — E538 Deficiency of other specified B group vitamins: Secondary | ICD-10-CM | POA: Diagnosis not present

## 2023-08-25 DIAGNOSIS — Z0000 Encounter for general adult medical examination without abnormal findings: Secondary | ICD-10-CM | POA: Diagnosis not present

## 2023-08-25 DIAGNOSIS — Z79899 Other long term (current) drug therapy: Secondary | ICD-10-CM | POA: Diagnosis not present

## 2023-08-26 DIAGNOSIS — H353221 Exudative age-related macular degeneration, left eye, with active choroidal neovascularization: Secondary | ICD-10-CM | POA: Diagnosis not present

## 2023-08-27 DIAGNOSIS — H353112 Nonexudative age-related macular degeneration, right eye, intermediate dry stage: Secondary | ICD-10-CM | POA: Diagnosis not present

## 2023-09-26 DIAGNOSIS — H353112 Nonexudative age-related macular degeneration, right eye, intermediate dry stage: Secondary | ICD-10-CM | POA: Diagnosis not present

## 2023-10-13 DIAGNOSIS — H353221 Exudative age-related macular degeneration, left eye, with active choroidal neovascularization: Secondary | ICD-10-CM | POA: Diagnosis not present

## 2023-10-26 DIAGNOSIS — H353112 Nonexudative age-related macular degeneration, right eye, intermediate dry stage: Secondary | ICD-10-CM | POA: Diagnosis not present

## 2023-11-25 DIAGNOSIS — H353221 Exudative age-related macular degeneration, left eye, with active choroidal neovascularization: Secondary | ICD-10-CM | POA: Diagnosis not present

## 2023-11-25 DIAGNOSIS — H353112 Nonexudative age-related macular degeneration, right eye, intermediate dry stage: Secondary | ICD-10-CM | POA: Diagnosis not present

## 2024-01-06 DIAGNOSIS — H353221 Exudative age-related macular degeneration, left eye, with active choroidal neovascularization: Secondary | ICD-10-CM | POA: Diagnosis not present

## 2024-01-06 DIAGNOSIS — H353131 Nonexudative age-related macular degeneration, bilateral, early dry stage: Secondary | ICD-10-CM | POA: Diagnosis not present

## 2024-01-06 DIAGNOSIS — Z961 Presence of intraocular lens: Secondary | ICD-10-CM | POA: Diagnosis not present

## 2024-01-24 DIAGNOSIS — H353132 Nonexudative age-related macular degeneration, bilateral, intermediate dry stage: Secondary | ICD-10-CM | POA: Diagnosis not present

## 2024-02-02 ENCOUNTER — Other Ambulatory Visit: Payer: Self-pay | Admitting: Internal Medicine

## 2024-02-02 DIAGNOSIS — Z1231 Encounter for screening mammogram for malignant neoplasm of breast: Secondary | ICD-10-CM

## 2024-02-23 DIAGNOSIS — H353132 Nonexudative age-related macular degeneration, bilateral, intermediate dry stage: Secondary | ICD-10-CM | POA: Diagnosis not present

## 2024-02-24 DIAGNOSIS — H353221 Exudative age-related macular degeneration, left eye, with active choroidal neovascularization: Secondary | ICD-10-CM | POA: Diagnosis not present

## 2024-03-01 ENCOUNTER — Ambulatory Visit
Admission: RE | Admit: 2024-03-01 | Discharge: 2024-03-01 | Disposition: A | Discharge status: Home / Self Care | Source: Ambulatory Visit | Attending: Internal Medicine | Admitting: Internal Medicine

## 2024-03-01 DIAGNOSIS — Z1231 Encounter for screening mammogram for malignant neoplasm of breast: Secondary | ICD-10-CM | POA: Diagnosis not present

## 2024-03-24 DIAGNOSIS — H353132 Nonexudative age-related macular degeneration, bilateral, intermediate dry stage: Secondary | ICD-10-CM | POA: Diagnosis not present

## 2024-04-04 DIAGNOSIS — H353221 Exudative age-related macular degeneration, left eye, with active choroidal neovascularization: Secondary | ICD-10-CM | POA: Diagnosis not present

## 2024-04-23 DIAGNOSIS — H353132 Nonexudative age-related macular degeneration, bilateral, intermediate dry stage: Secondary | ICD-10-CM | POA: Diagnosis not present

## 2024-05-23 DIAGNOSIS — H353132 Nonexudative age-related macular degeneration, bilateral, intermediate dry stage: Secondary | ICD-10-CM | POA: Diagnosis not present

## 2024-05-28 DIAGNOSIS — H353221 Exudative age-related macular degeneration, left eye, with active choroidal neovascularization: Secondary | ICD-10-CM | POA: Diagnosis not present

## 2024-06-22 DIAGNOSIS — H353132 Nonexudative age-related macular degeneration, bilateral, intermediate dry stage: Secondary | ICD-10-CM | POA: Diagnosis not present
